# Patient Record
Sex: Male | Born: 1960 | Race: Black or African American | Hispanic: No | Marital: Single | Smoking: Never smoker
Health system: Southern US, Community
[De-identification: ages and names within clinical notes are randomized; demographics above are authoritative.]

---

## 2010-12-31 ENCOUNTER — Emergency Department (HOSPITAL_COMMUNITY)
Admission: EM | Admit: 2010-12-31 | Discharge: 2011-01-01 | Disposition: A | Discharge status: Home / Self Care | Payer: Worker's Compensation | Attending: Emergency Medicine | Admitting: Emergency Medicine

## 2010-12-31 ENCOUNTER — Emergency Department (HOSPITAL_COMMUNITY): Payer: Worker's Compensation

## 2010-12-31 DIAGNOSIS — M7989 Other specified soft tissue disorders: Secondary | ICD-10-CM | POA: Insufficient documentation

## 2010-12-31 DIAGNOSIS — M79609 Pain in unspecified limb: Secondary | ICD-10-CM | POA: Insufficient documentation

## 2010-12-31 DIAGNOSIS — M25569 Pain in unspecified knee: Secondary | ICD-10-CM | POA: Insufficient documentation

## 2011-01-01 ENCOUNTER — Ambulatory Visit (HOSPITAL_COMMUNITY)
Admission: RE | Admit: 2011-01-01 | Discharge: 2011-01-01 | Disposition: A | Discharge status: Home / Self Care | Payer: Worker's Compensation | Source: Ambulatory Visit | Attending: Emergency Medicine | Admitting: Emergency Medicine

## 2011-01-01 DIAGNOSIS — M79609 Pain in unspecified limb: Secondary | ICD-10-CM

## 2011-01-01 DIAGNOSIS — M7989 Other specified soft tissue disorders: Secondary | ICD-10-CM | POA: Insufficient documentation

## 2018-02-19 ENCOUNTER — Emergency Department (HOSPITAL_COMMUNITY)
Admission: EM | Admit: 2018-02-19 | Discharge: 2018-02-19 | Disposition: A | Discharge status: Home / Self Care | Payer: Medicaid Other | Attending: Emergency Medicine | Admitting: Emergency Medicine

## 2018-02-19 ENCOUNTER — Emergency Department (HOSPITAL_COMMUNITY): Payer: Medicaid Other

## 2018-02-19 ENCOUNTER — Encounter (HOSPITAL_COMMUNITY): Payer: Self-pay | Admitting: Emergency Medicine

## 2018-02-19 ENCOUNTER — Other Ambulatory Visit: Payer: Self-pay

## 2018-02-19 DIAGNOSIS — M542 Cervicalgia: Secondary | ICD-10-CM

## 2018-02-19 DIAGNOSIS — M546 Pain in thoracic spine: Secondary | ICD-10-CM | POA: Insufficient documentation

## 2018-02-19 DIAGNOSIS — G8929 Other chronic pain: Secondary | ICD-10-CM

## 2018-02-19 DIAGNOSIS — M25511 Pain in right shoulder: Secondary | ICD-10-CM

## 2018-02-19 DIAGNOSIS — R0789 Other chest pain: Secondary | ICD-10-CM | POA: Insufficient documentation

## 2018-02-19 DIAGNOSIS — R202 Paresthesia of skin: Secondary | ICD-10-CM | POA: Insufficient documentation

## 2018-02-19 DIAGNOSIS — T1490XA Injury, unspecified, initial encounter: Secondary | ICD-10-CM

## 2018-02-19 MED ORDER — CYCLOBENZAPRINE HCL 10 MG PO TABS: 10.0000 mg | ORAL_TABLET | Freq: Two times a day (BID) | ORAL | 0 refills | Status: AC | PRN

## 2018-02-19 MED ORDER — OXYCODONE-ACETAMINOPHEN 5-325 MG PO TABS
1.0000 | ORAL_TABLET | Freq: Once | ORAL | Status: AC
  Administered 2018-02-19: 1 via ORAL
  Filled 2018-02-19: qty 1
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
## 2018-02-19 NOTE — Discharge Instructions (Signed)
Thank you for allowing me to care for you today in the Emergency Department.   Take 600 mg of ibuprofen with food or 650 mg of Tylenol every 6 hours for pain control.  Start to gently move your shoulder through range of motion exercises as your pain allows.  You can apply ice for 15 to 20 minutes up to 3-4 times a day.  You can also try acupuncture to see if your symptoms improve.  He can also try taking Flexeril.  You can take 1 tablet up to 2 times per day.  Do not drive or work while taking this medication because it is a muscle relaxer and can help with muscle pain, but can also make you drowsy.  You should also not drink alcohol taking this medication.  Call [facility] to schedule an appointment regarding some of the numbness and weakness he been having in your right hand based on your CT today.  For your right shoulder, call Dr. Everardo Pacific, orthopedist, for follow-up of the symptoms.  Return to the emergency department if you develop new or worsening symptoms, including if your fingers turn blue, if you have new numbness or weakness in another part of your body, or if you have any new fall or injury.

## 2019-08-24 ENCOUNTER — Encounter (HOSPITAL_COMMUNITY): Payer: Self-pay | Admitting: Emergency Medicine

## 2019-08-24 ENCOUNTER — Other Ambulatory Visit: Payer: Self-pay

## 2019-08-24 ENCOUNTER — Emergency Department (HOSPITAL_COMMUNITY): Payer: BLUE CROSS/BLUE SHIELD

## 2019-08-24 ENCOUNTER — Emergency Department (HOSPITAL_COMMUNITY)
Admission: EM | Admit: 2019-08-24 | Discharge: 2019-08-24 | Disposition: A | Discharge status: Home / Self Care | Payer: BLUE CROSS/BLUE SHIELD | Attending: Emergency Medicine | Admitting: Emergency Medicine

## 2019-08-24 DIAGNOSIS — W19XXXA Unspecified fall, initial encounter: Secondary | ICD-10-CM

## 2019-08-24 DIAGNOSIS — R519 Headache, unspecified: Secondary | ICD-10-CM | POA: Diagnosis not present

## 2019-08-24 DIAGNOSIS — Y929 Unspecified place or not applicable: Secondary | ICD-10-CM | POA: Insufficient documentation

## 2019-08-24 DIAGNOSIS — Y939 Activity, unspecified: Secondary | ICD-10-CM | POA: Insufficient documentation

## 2019-08-24 DIAGNOSIS — R0789 Other chest pain: Secondary | ICD-10-CM | POA: Insufficient documentation

## 2019-08-24 DIAGNOSIS — R29898 Other symptoms and signs involving the musculoskeletal system: Secondary | ICD-10-CM | POA: Diagnosis not present

## 2019-08-24 DIAGNOSIS — W108XXA Fall (on) (from) other stairs and steps, initial encounter: Secondary | ICD-10-CM

## 2019-08-24 DIAGNOSIS — R109 Unspecified abdominal pain: Secondary | ICD-10-CM | POA: Insufficient documentation

## 2019-08-24 DIAGNOSIS — R200 Anesthesia of skin: Secondary | ICD-10-CM | POA: Diagnosis not present

## 2019-08-24 DIAGNOSIS — Y999 Unspecified external cause status: Secondary | ICD-10-CM | POA: Diagnosis not present

## 2019-08-24 LAB — I-STAT CHEM 8, ED
BUN: 12 mg/dL (ref 6–20)
Calcium, Ion: 1.11 mmol/L — ABNORMAL LOW (ref 1.15–1.40)
Chloride: 104 mmol/L (ref 98–111)
Creatinine, Ser: 1 mg/dL (ref 0.61–1.24)
Glucose, Bld: 134 mg/dL — ABNORMAL HIGH (ref 70–99)
HCT: 50 % (ref 39.0–52.0)
Hemoglobin: 17 g/dL (ref 13.0–17.0)
Potassium: 3.6 mmol/L (ref 3.5–5.1)
Sodium: 138 mmol/L (ref 135–145)
TCO2: 24 mmol/L (ref 22–32)

## 2019-08-24 LAB — CBC
HCT: 47.3 % (ref 39.0–52.0)
Hemoglobin: 15 g/dL (ref 13.0–17.0)
MCH: 24.8 pg — ABNORMAL LOW (ref 26.0–34.0)
MCHC: 31.7 g/dL (ref 30.0–36.0)
MCV: 78.1 fL — ABNORMAL LOW (ref 80.0–100.0)
Platelets: 287 10*3/uL (ref 150–400)
RBC: 6.06 MIL/uL — ABNORMAL HIGH (ref 4.22–5.81)
RDW: 14.6 % (ref 11.5–15.5)
WBC: 6.9 10*3/uL (ref 4.0–10.5)
nRBC: 0 % (ref 0.0–0.2)

## 2019-08-24 LAB — COMPREHENSIVE METABOLIC PANEL
ALT: 42 U/L (ref 0–44)
AST: 40 U/L (ref 15–41)
Albumin: 4.5 g/dL (ref 3.5–5.0)
Alkaline Phosphatase: 49 U/L (ref 38–126)
Anion gap: 13 (ref 5–15)
BUN: 10 mg/dL (ref 6–20)
CO2: 21 mmol/L — ABNORMAL LOW (ref 22–32)
Calcium: 9.7 mg/dL (ref 8.9–10.3)
Chloride: 102 mmol/L (ref 98–111)
Creatinine, Ser: 1.08 mg/dL (ref 0.61–1.24)
GFR calc Af Amer: >60 mL/min (ref >60)
GFR calc non Af Amer: >60 mL/min (ref >60)
Glucose, Bld: 136 mg/dL — ABNORMAL HIGH (ref 70–99)
Potassium: 3.9 mmol/L (ref 3.5–5.1)
Sodium: 136 mmol/L (ref 135–145)
Total Bilirubin: 1.5 mg/dL — ABNORMAL HIGH (ref 0.3–1.2)
Total Protein: 7.8 g/dL (ref 6.5–8.1)

## 2019-08-24 LAB — SAMPLE TO BLOOD BANK

## 2019-08-24 LAB — URINALYSIS, ROUTINE W REFLEX MICROSCOPIC
Bilirubin Urine: NEGATIVE
Glucose, UA: NEGATIVE mg/dL
Hgb urine dipstick: NEGATIVE
Ketones, ur: NEGATIVE mg/dL
Leukocytes,Ua: NEGATIVE
Nitrite: NEGATIVE
Protein, ur: NEGATIVE mg/dL
Specific Gravity, Urine: >1.046 — ABNORMAL HIGH (ref 1.005–1.030)
pH: 6 (ref 5.0–8.0)

## 2019-08-24 LAB — PROTIME-INR
INR: 1 (ref 0.8–1.2)
Prothrombin Time: 13.3 seconds (ref 11.4–15.2)

## 2019-08-24 LAB — ETHANOL: Alcohol, Ethyl (B): <10 mg/dL (ref <10)

## 2019-08-24 LAB — LACTIC ACID, PLASMA: Lactic Acid, Venous: 1.7 mmol/L (ref 0.5–1.9)

## 2019-08-24 MED ORDER — HYDROCODONE-ACETAMINOPHEN 5-325 MG PO TABS: 1.0000 | ORAL_TABLET | ORAL | 0 refills | Status: AC | PRN

## 2019-08-24 MED ORDER — MORPHINE SULFATE (PF) 4 MG/ML IV SOLN
4.0000 mg | Freq: Once | INTRAVENOUS | Status: AC
  Administered 2019-08-24: 4 mg via INTRAVENOUS
  Filled 2019-08-24: qty 1

## 2019-08-24 MED ORDER — ONDANSETRON HCL 4 MG/2ML IJ SOLN
4.0000 mg | Freq: Once | INTRAMUSCULAR | Status: AC
  Administered 2019-08-24: 11:00:00 4 mg via INTRAVENOUS
  Filled 2019-08-24: qty 2

## 2019-08-24 MED ORDER — IOHEXOL 300 MG/ML  SOLN
100.0000 mL | Freq: Once | INTRAMUSCULAR | Status: AC | PRN
  Administered 2019-08-24: 12:00:00 100 mL via INTRAVENOUS
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
## 2019-08-24 NOTE — ED Notes (Addendum)
Manual BP was 166/90 taken left arm

## 2019-08-24 NOTE — ED Notes (Signed)
Pt transported to CT ?

## 2019-08-24 NOTE — Discharge Instructions (Addendum)
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  You will need to follow up with a spine surgeon for further evaluation.

## 2019-08-24 NOTE — ED Notes (Signed)
Pt transported to MRI 

## 2019-08-24 NOTE — ED Notes (Signed)
Frank Gallagher: (fiance) [phone]
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
## 2020-08-13 IMAGING — CT CT CHEST W/ CM
3 of 5 series · 14 of 36 positions shown, 17 images · IV contrast (omnipaque)
Comparison: CT chest 02/19/2018.

CLINICAL DATA: Status post fall last night with a blow to the right
hip. Onset chest pain, nausea and diaphoresis when the patient woke
up this morning. Initial encounter.

EXAM:
CT CHEST, ABDOMEN, AND PELVIS WITH CONTRAST
TECHNIQUE: Multidetector CT imaging of the chest, abdomen and pelvis was
performed following the standard protocol during bolus
administration of intravenous contrast.
CONTRAST:  100 mL OMNIPAQUE IOHEXOL 300 MG/ML  SOLN

[Series 4: cap with · axial · 0.84mm/px · z∈[-780,-274]mm · 9 of 127 slices shown, 12 images]
[im 13/127  mediastinal]
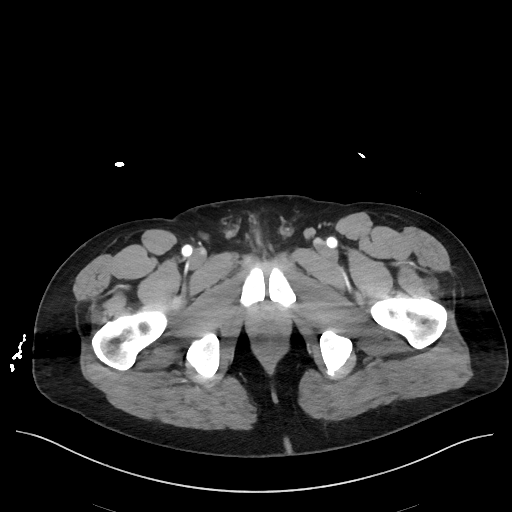
[im 13/127  lung]
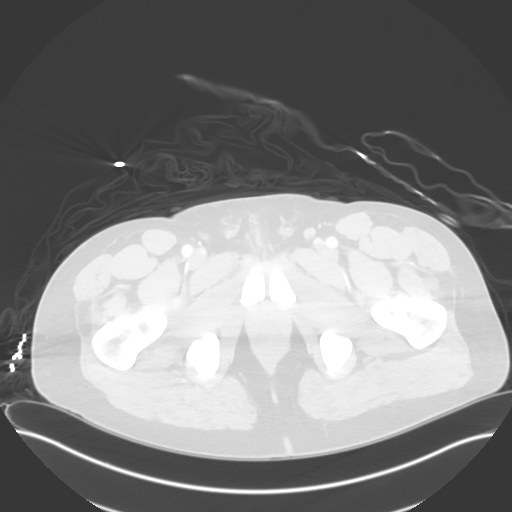
[im 26/127  lung]
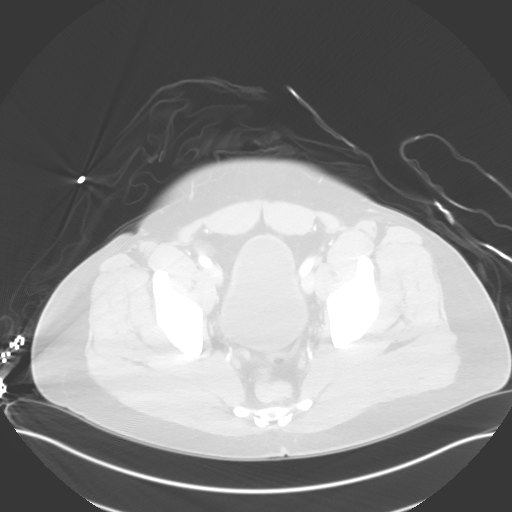
[im 38/127  lung]
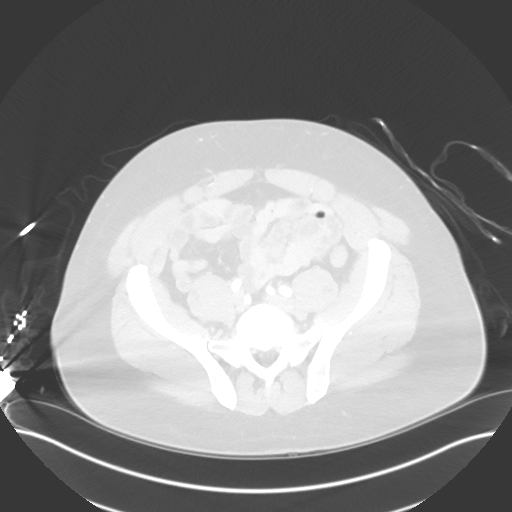
[im 51/127  lung]
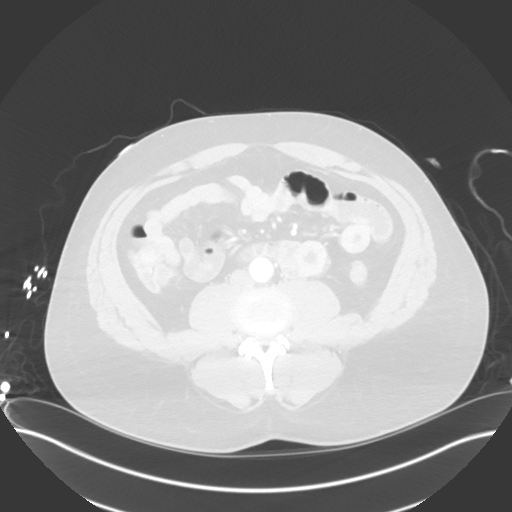
[im 64/127  mediastinal]
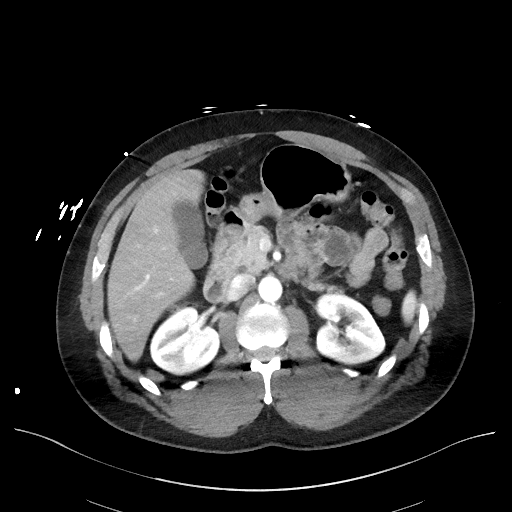
[im 64/127  lung]
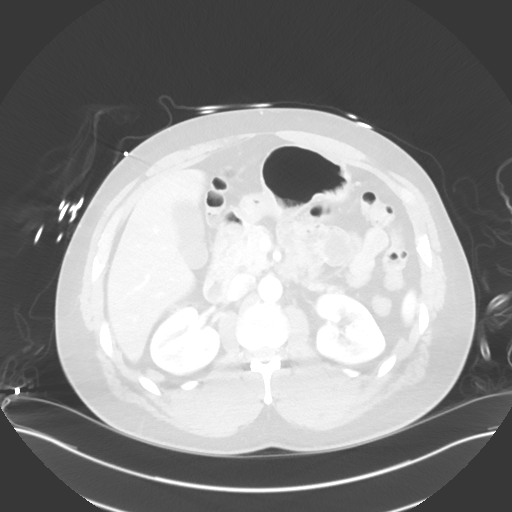
[im 76/127  lung]
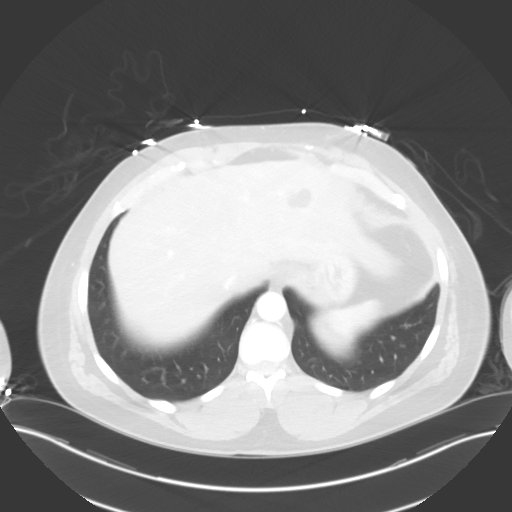
[im 89/127  lung]
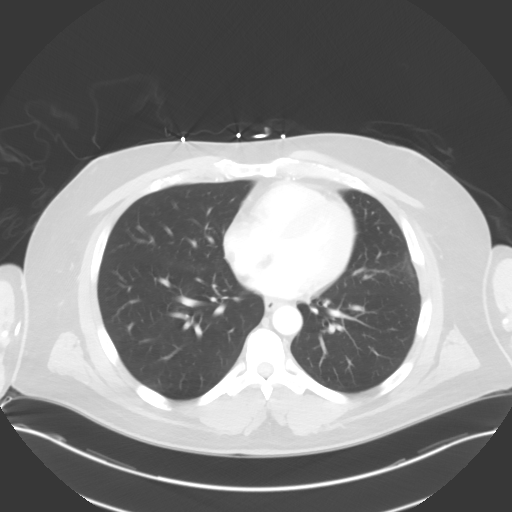
[im 101/127  lung]
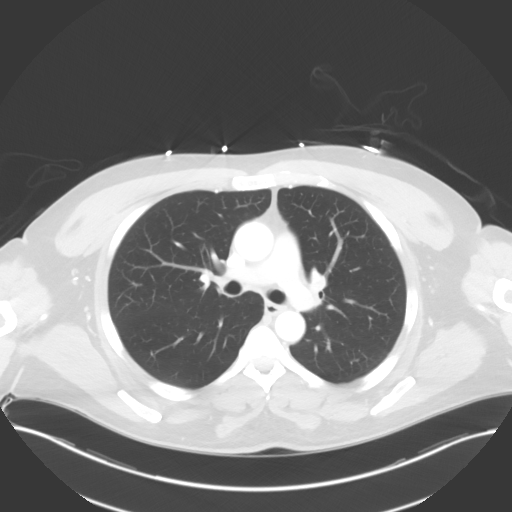
[im 114/127  mediastinal]
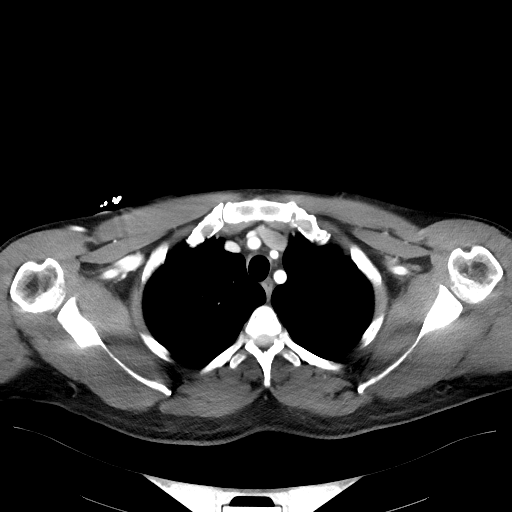
[im 114/127  lung]
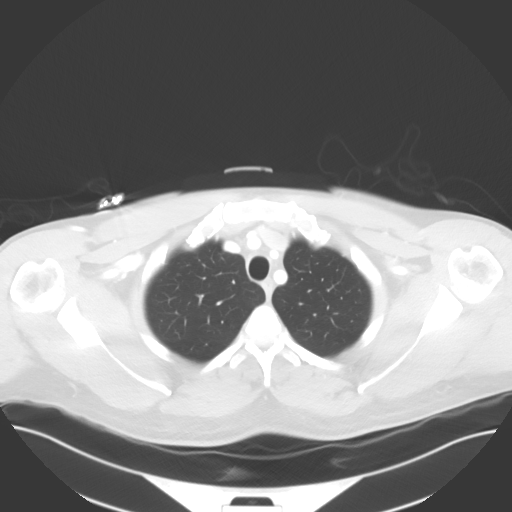

[Series 5: lung · axial · 0.70mm/px · z∈[-508,-458]mm · 2 of 162 slices shown]
[im 13/162  lung]
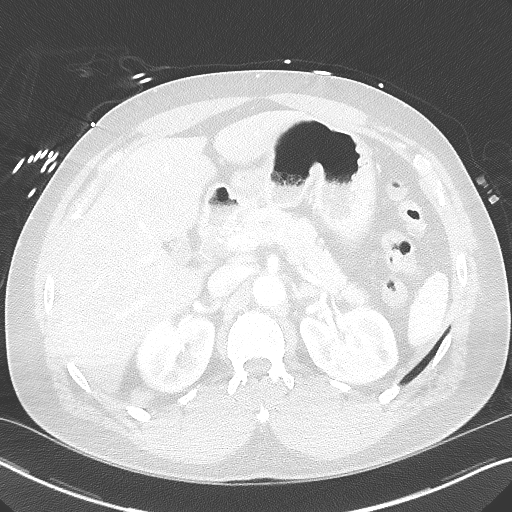
[im 38/162  lung]
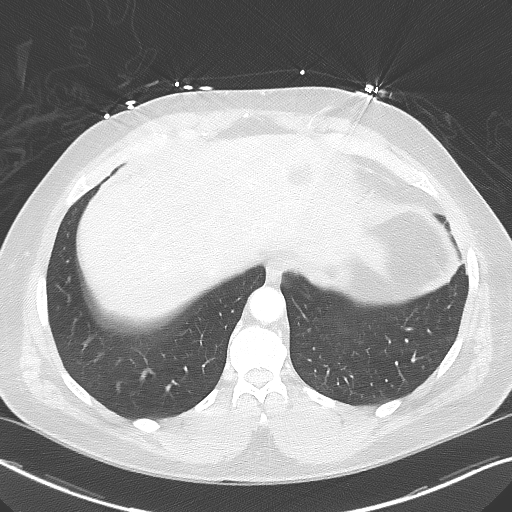

[Series 8: cap with 3.0 mm st cor · coronal · 0.74mm/px · 3 of 104 slices shown]
[im 21/104  lung]
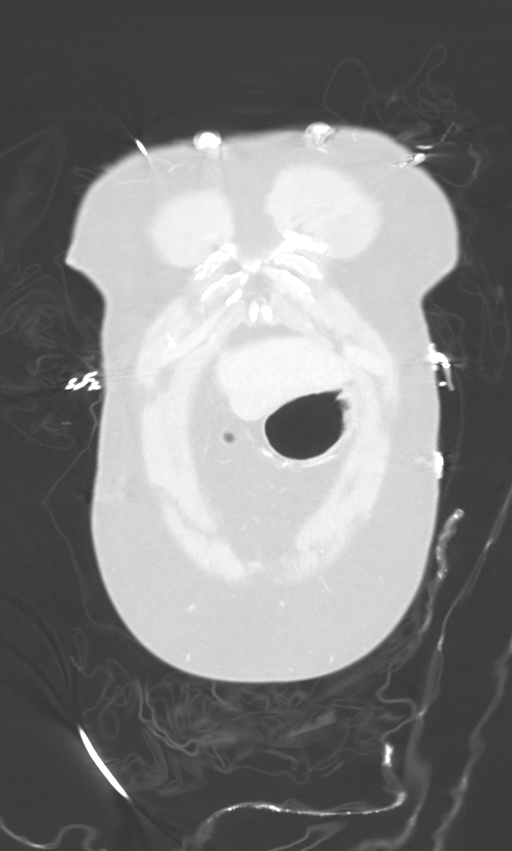
[im 42/104  lung]
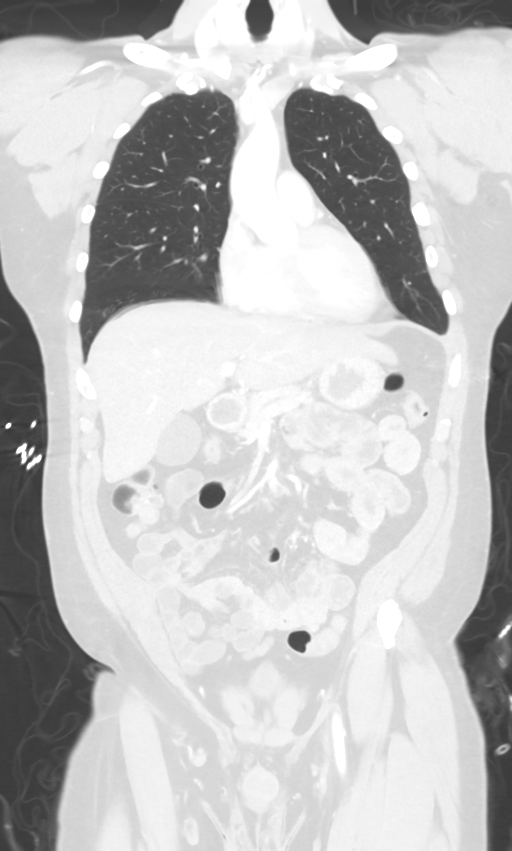
[im 62/104  lung]
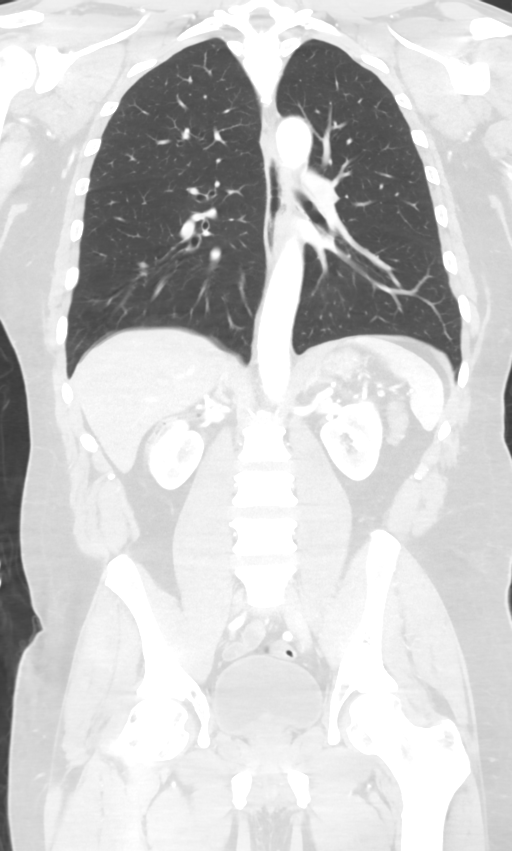

[14 of 36 positions shown; findings below may reference images not displayed]

FINDINGS: CT CHEST FINDINGS

Cardiovascular: No significant vascular findings. Normal heart size.
No pericardial effusion.

Mediastinum/Nodes: No enlarged mediastinal, hilar, or axillary lymph
nodes. Thyroid gland, trachea, and esophagus demonstrate no
significant findings.

Lungs/Pleura: Lungs are clear. No pleural effusion or pneumothorax.

Musculoskeletal: Remote healed left second through sixth rib
fractures are seen. No acute fracture or worrisome bony lesion.

CT ABDOMEN PELVIS FINDINGS

Hepatobiliary: Small cyst in left hepatic lobe is noted. The liver
otherwise appears normal. Gallbladder and biliary tree are normal.

Pancreas: Unremarkable. No pancreatic ductal dilatation or
surrounding inflammatory changes.

Spleen: Normal in size without focal abnormality.

Adrenals/Urinary Tract: The adrenal glands appear normal. Small cyst
in the upper pole of the left kidney is noted. Kidneys are otherwise
normal appearance. Ureters and urinary bladder are normal.

Stomach/Bowel: Stomach is within normal limits. Appendix appears
normal. No evidence of bowel wall thickening, distention, or
inflammatory changes.

Vascular/Lymphatic: No significant vascular findings are present. No
enlarged abdominal or pelvic lymph nodes.

Reproductive: Prostate is unremarkable.

Other: None.

Musculoskeletal: No acute or focal bony abnormality.
IMPRESSION: No acute abnormality chest, abdomen or pelvis. No finding to explain
the patient's symptoms.

Remote healed left second through sixth rib fractures.
